# Patient Record
Sex: Male | Born: 1965 | Race: White | Hispanic: Refuse to answer | Smoking: Current every day smoker
Health system: Southern US, Community
[De-identification: ages and names within clinical notes are randomized; demographics above are authoritative.]

## PROBLEM LIST (undated history)

## (undated) DIAGNOSIS — F419 Anxiety disorder, unspecified: Secondary | ICD-10-CM

## (undated) DIAGNOSIS — F32A Depression, unspecified: Secondary | ICD-10-CM

## (undated) DIAGNOSIS — F329 Major depressive disorder, single episode, unspecified: Secondary | ICD-10-CM

## (undated) DIAGNOSIS — Z5189 Encounter for other specified aftercare: Secondary | ICD-10-CM

## (undated) DIAGNOSIS — F1910 Other psychoactive substance abuse, uncomplicated: Secondary | ICD-10-CM

## (undated) HISTORY — DX: Encounter for other specified aftercare: Z51.89

## (undated) HISTORY — DX: Anxiety disorder, unspecified: F41.9

## (undated) HISTORY — DX: Other psychoactive substance abuse, uncomplicated: F19.10

## (undated) HISTORY — DX: Depression, unspecified: F32.A

## (undated) HISTORY — DX: Major depressive disorder, single episode, unspecified: F32.9

## (undated) HISTORY — PX: OTHER SURGICAL HISTORY: SHX169

---

## 2016-02-21 ENCOUNTER — Ambulatory Visit (INDEPENDENT_AMBULATORY_CARE_PROVIDER_SITE_OTHER): Payer: Self-pay | Admitting: Physician Assistant

## 2016-02-21 ENCOUNTER — Encounter: Payer: Self-pay | Admitting: Physician Assistant

## 2016-02-21 VITALS — BP 118/62 | HR 72 | Temp 97.7°F | Resp 18 | Ht 67.0 in | Wt 218.0 lb

## 2016-02-21 DIAGNOSIS — M5441 Lumbago with sciatica, right side: Secondary | ICD-10-CM

## 2016-02-21 DIAGNOSIS — F319 Bipolar disorder, unspecified: Secondary | ICD-10-CM

## 2016-02-21 DIAGNOSIS — M542 Cervicalgia: Secondary | ICD-10-CM

## 2016-02-21 DIAGNOSIS — B360 Pityriasis versicolor: Secondary | ICD-10-CM

## 2016-02-21 MED ORDER — KETOCONAZOLE 2 % EX SHAM: MEDICATED_SHAMPOO | CUTANEOUS | 0 refills | Status: AC

## 2016-02-21 MED ORDER — CYCLOBENZAPRINE HCL 10 MG PO TABS: 10.0000 mg | ORAL_TABLET | Freq: Three times a day (TID) | ORAL | 0 refills | Status: AC | PRN

## 2016-02-21 MED ORDER — PREDNISONE 20 MG PO TABS: ORAL_TABLET | ORAL | 0 refills | Status: DC

## 2016-02-22 NOTE — Progress Notes (Signed)
Patient ID: Paul OuDonald Herrera MRN: [id number], DOB: 08/25/1965, 50 y.o. Date of Encounter: @DATE @  Chief Complaint:  Chief Complaint  Patient presents with  . New Patient (Initial Visit)        HPI: 50 y.o. year old male  [facility]   He says he recently moved here from [place] Says that when living there he only went to PCP prn.   He has had problems with Depression for >20 years. Has Bipolar D/O and Anxiety D/O. Was seeing a Theme park managers[facility]iatrist and Counselor in [place] and plans to go to [facility] here.   He has several things he wants to address today:  Neck Pain: Says this started August 22nd. He remembers because he moved August 21st. Says he thought the neck pain may be secondary to different, larger pillows. But, says he has changed pillows and neck pain has continued. Pain is on right side of neck. He hears popping/cracking sounds there when he moves his neck. No pain, numbness, tingling, wekaness down the arm or hand.   He does not work. Is on Disability "I have social anxiety, I can't deal with people".   Has been doing no activity that affects his neck per se.   Right "hip" pain: Has been feeling this for ~ 1 week. It gets worse as the day progresses. The more he is up doing things. He is unaware of any trauma or injury or anything that caused this pain. Points to lateral aspect of hip as area of pain.   Rash: Has had this rash on chest for months. Has not used any treatment to it.   No other concerns to address today.    Past Medical History:  Diagnosis Date  . Anxiety   . Blood transfusion without reported diagnosis   . Depression   . Substance abuse      Home Meds: No outpatient prescriptions prior to visit.   No facility-administered medications prior to visit.     Allergies: No Known Allergies  Social History   Social History  . Marital status: Divorced    Spouse name: N/A  . Number of children:  N/A  . Years of education: N/A   Occupational History  . Not on file.   Social History Main Topics  . Smoking status: Current Every Day Smoker    Packs/day: 2.00    Years: 15.00  . Smokeless tobacco: Never Used  . Alcohol use Yes     Comment: sober 2 months  . Drug use: No  . Sexual activity: No   Other Topics Concern  . Not on file   Social History Narrative  . No narrative on file    History reviewed. No pertinent family history.   Review of Systems:  See HPI for pertinent ROS. All other ROS negative.    Physical Exam: Blood pressure 118/62, pulse 72, temperature 97.7 F (36.5 C), temperature source Oral, resp. rate 18, height 5\' 7"  (1.702 m), weight 218 lb (98.9 kg), SpO2 96 %., Body mass index is 34.14 kg/m. General: WM. Appears in no acute distress. Neck: Supple. No thyromegaly. No lymphadenopathy. Lungs: Clear bilaterally to auscultation without wheezes, rales, or rhonchi. Breathing is unlabored. Heart: RRR with S1 S2. No murmurs, rubs, or gallops. Abdomen: Soft, non-tender, non-distended with normoactive bowel sounds. No hepatomegaly. No rebound/guarding. No obvious abdominal masses. Musculoskeletal:  Right side of neck is severely tender with palpation.  When he turns his head  to the right, he feesl pain in right neck. When he turns head to left, feels tightness/pain in right neck.  The same occurs when he tilts head to each side.  5/5 strength in bilateral arms and grips strength 5/5 bilaterally.  StraightLegRaise normal bilaterally. Left hip abduction normal. Right hip abduction causes pain in right low back. Skin: Chest covered with macular rash that is salmon-colored, splotchy Neuro: Alert and oriented X 3. Moves all extremities spontaneously. Gait is normal. CNII-XII grossly in tact. Psych:  Responds to questions appropriately with a normal affect.     ASSESSMENT AND PLAN:  50 y.o. year old male with  1. Neck pain on right side Discussed making sure  his pillows are right for him--to maintain correct cervical spine positioning. Discussed apply heat to area using heating pad or warm water in shower.  Discussed stretching, ROM throughout the day of neck and shoulders.  Cautioned that Flexeril may cause drwosiness. Do not take prior to driving.  - cyclobenzaprine (FLEXERIL) 10 MG tablet; Take 1 tablet (10 mg total) by mouth 3 (three) times daily as needed for muscle spasms.  Dispense: 30 tablet; Refill: 0  2. Acute right-sided low back pain with right-sided sciatica Discussed this is coming from low back, sciatic nerve.  Discussed applying heat to area, stretching.  Take prednisone taper as directed. Use Flexeril prn. Cautioned that Flexeril may cause drwosiness. Do not take prior to driving.   - predniSONE (DELTASONE) 20 MG tablet; Take 3 daily for 2 days, then 2 daily for 2 days, then 1 daily for 2 days.  Dispense: 12 tablet; Refill: 0 - cyclobenzaprine (FLEXERIL) 10 MG tablet; Take 1 tablet (10 mg total) by mouth 3 (three) times daily as needed for muscle spasms.  Dispense: 30 tablet; Refill: 0  3. Tinea versicolor He is to apply as directed for several weeks, until rash resolves.  - ketoconazole (NIZORAL) 2 % shampoo; Apply to affected area, leave on for 5 minutes, rinse.  Dispense: 120 mL; Refill: 0  4. Bipolar affective disorder, remission status unspecified (HCC) Managed by Psych  Discussed having him schedule CPE and he is agreeable.  To schedule early morning--come fasting.    Signed, [address] [place], [place], BSFM 02/22/2016 8:30 AM

## 2016-03-17 ENCOUNTER — Encounter: Payer: Self-pay | Admitting: Physician Assistant

## 2016-04-16 ENCOUNTER — Ambulatory Visit
Admission: RE | Admit: 2016-04-16 | Discharge: 2016-04-16 | Disposition: A | Discharge status: Home / Self Care | Payer: Medicare Other | Source: Ambulatory Visit | Attending: Physician Assistant | Admitting: Physician Assistant

## 2016-04-16 ENCOUNTER — Encounter: Payer: Self-pay | Admitting: Physician Assistant

## 2016-04-16 ENCOUNTER — Ambulatory Visit (INDEPENDENT_AMBULATORY_CARE_PROVIDER_SITE_OTHER): Payer: Medicare Other | Admitting: Physician Assistant

## 2016-04-16 VITALS — BP 112/74 | HR 84 | Temp 98.7°F | Resp 12 | Ht 67.0 in | Wt 213.0 lb

## 2016-04-16 DIAGNOSIS — M545 Low back pain: Secondary | ICD-10-CM | POA: Diagnosis not present

## 2016-04-16 DIAGNOSIS — M542 Cervicalgia: Secondary | ICD-10-CM

## 2016-04-16 DIAGNOSIS — M5431 Sciatica, right side: Secondary | ICD-10-CM

## 2016-04-16 NOTE — Progress Notes (Signed)
Patient ID: Paul Herrera MRN: [id number], DOB: 07/05/1965, 51 y.o. Date of Encounter: @DATE @  Chief Complaint:  Chief Complaint  Patient presents with  . Pain Right Neck, Right Hip        HPI: 51 y.o. year old male    02/21/2016: Presents as a New Patient to Establish Care.   He says he recently moved here from [place] Says that when living there he only went to PCP prn.   He has had problems with Depression for >20 years. Has Bipolar D/O and Anxiety D/O. Was seeing a Theme [facility]iatrist and Counsel[place] in [place] and [place] to go [place] [facility] here.   He has several things he wants to address today:  Neck Pain: Says this started August 22nd. He remembers because he moved August 21st. Says he thought the neck pain may be secondary to different, larger pillows. But, says he has changed pillows and neck pain has continued. Pain is on right side of neck. He hears popping/cracking sounds there when he moves his neck. No pain, numbness, tingling, wekaness down the arm or hand.   He does not work. Is on Disability "I have social anxiety, I can't deal with people".   Has been doing no activity that affects his neck per se.   Right "hip" pain: Has been feeling this for ~ 1 week. It gets worse as the day progresses. The more he is up doing things. He is unaware of any trauma or injury or anything that caused this pain. Points to lateral aspect of hip as area of pain.   Rash: Has had this rash on chest for months. Has not used any treatment to it.   No other concerns to address today.    ASSESSMENT AND PLAN:  51 y.o. year old male with  1. Neck pain on right side Discussed making sure his pillows are right for him--to maintain correct cervical spine positioning. Discussed apply heat to area using heating pad or warm water in shower.  Discussed stretching, ROM throughout the day of neck and shoulders.  Cautioned that Flexeril may cause drwosiness. Do not take prior to  driving.  - cyclobenzaprine (FLEXERIL) 10 MG tablet; Take 1 tablet (10 mg total) by mouth 3 (three) times daily as needed for muscle spasms.  Dispense: 30 tablet; Refill: 0  2. Acute right-sided low back pain with right-sided sciatica Discussed this is coming from low back, sciatic nerve.  Discussed applying heat to area, stretching.  Take prednisone taper as directed. Use Flexeril prn. Cautioned that Flexeril may cause drwosiness. Do not take prior to driving.   - predniSONE (DELTASONE) 20 MG tablet; Take 3 daily for 2 days, then 2 daily for 2 days, then 1 daily for 2 days.  Dispense: 12 tablet; Refill: 0 - cyclobenzaprine (FLEXERIL) 10 MG tablet; Take 1 tablet (10 mg total) by mouth 3 (three) times daily as needed for muscle spasms.  Dispense: 30 tablet; Refill: 0  3. Tinea versicolor He is to apply as directed for several weeks, until rash resolves.  - ketoconazole (NIZORAL) 2 % shampoo; Apply to affected area, leave on for 5 minutes, rinse.  Dispense: 120 mL; Refill: 0  4. Bipolar affective disorder, remission status unspecified (HCC) Managed by Psych  Discussed having him schedule CPE and he is agreeable.  To schedule early morning--come fasting.    04/16/2016: Today he states that the right side of his neck hurts pretty much all the time. Says that if he  sits with his neck perfectly straight and still, he doesn't feel it much but if he moves his neck at all in any direction then he feels increased discomfort on the right side of his neck. He has had no pain down the right arm. No numbness or tingling or weakness in the right arm or hand.  He also has been feeling pain in the right sciatic notch region. This is where he points to. Says that whenever he is sitting-- that area is painful. Also whenever he initially stands up, it is painful there. No pain going down the leg. No numbness or tingling or weakness down the leg. Occasionally feels low back pain but this is mild.  Reviewed that  I had prescribed Flexeril at last visit and he says that that does help and he can tell that that does help relax these muscles but the pain has not completely resolved.   Past Medical History:  Diagnosis Date  . Anxiety   . Blood transfusion without reported diagnosis   . Depression   . Substance abuse      Home Meds: Outpatient Medications Prior to Visit  Medication Sig Dispense Refill  . cyclobenzaprine (FLEXERIL) 10 MG tablet Take 1 tablet (10 mg total) by mouth 3 (three) times daily as needed for muscle spasms. 30 tablet 0  . ketoconazole (NIZORAL) 2 % shampoo Apply to affected area, leave on for 5 minutes, rinse. 120 mL 0  . traZODone (DESYREL) 150 MG tablet Take 150 mg by mouth at bedtime.    . citalopram (CELEXA) 20 MG tablet Take 20 mg by mouth at bedtime.    . predniSONE (DELTASONE) 20 MG tablet Take 3 daily for 2 days, then 2 daily for 2 days, then 1 daily for 2 days. (Patient not taking: Reported on 04/16/2016) 12 tablet 0   No facility-administered medications prior to visit.     Allergies: No Known Allergies  Social History   Social History  . Marital status: Divorced    Spouse name: N/A  . Number of children: N/A  . Years of education: N/A   Occupational History  . Not on file.   Social History Main Topics  . Smoking status: Current Every Day Smoker    Packs/day: 2.00    Years: 15.00  . Smokeless tobacco: Never Used  . Alcohol use Yes     Comment: sober 2 months  . Drug use: No  . Sexual activity: No   Other Topics Concern  . Not on file   Social History Narrative  . No narrative on file    History reviewed. No pertinent family history.   Review of Systems:  See HPI for pertinent ROS. All other ROS negative.    Physical Exam: Blood pressure 112/74, pulse 84, temperature 98.7 F (37.1 C), temperature source Oral, resp. rate 12, height 5\' 7"  (1.702 m), weight 213 lb (96.6 kg), SpO2 96 %., Body mass index is 33.36 kg/m. General: WM. Appears in  no acute distress. Neck: Supple. No thyromegaly. No lymphadenopathy. Lungs: Clear bilaterally to auscultation without wheezes, rales, or rhonchi. Breathing is unlabored. Heart: RRR with S1 S2. No murmurs, rubs, or gallops. Abdomen: Soft, non-tender, non-distended with normoactive bowel sounds. No hepatomegaly. No rebound/guarding. No obvious abdominal masses. Musculoskeletal:  Right side of neck is mildly to tender with palpation.  When he turns his head to the right, he feels pain in right neck. When he turns head to left, feels tightness/pain in right neck.  The  same occurs when he tilts head to each side.  5/5 strength in bilateral arms and grips strength 5/5 bilaterally.  Straight Leg Raise---he can raise to >60 degrees on the right, but then causes pain at right sciatic notch. Straight leg raise on the left is normal. Right Hip Abduction: Can abduct fully but when he completely abducts this does cause pain in the right sciatic notch region. Left hip abduction normal.    Skin: Chest covered with macular rash that is salmon-colored, splotchy Neuro: Alert and oriented X 3. Moves all extremities spontaneously. Gait is normal. CNII-XII grossly in tact. Psych:  Responds to questions appropriately with a normal affect.     ASSESSMENT AND PLAN:  51 y.o. year old male with   1. Neck pain on right side Will obtain x-ray. Will follow-up with him once I get this result. He states that the Flexeril does help so he can continue using this in the interim. Also at last visit we already discussed applying heat and stretches and range of motion. - DG Cervical Spine Complete; Future  2. Right sided sciatica Will obtain x-ray. Will follow-up with him once I get this resolved. He states that the Flexeril does help so he can continue using this in the interim. Also at the last visit already discussed applying heat and stretches for the low back. - DG Lumbar Spine Complete; Future     Signed, Shon [address] [place], [place], [facility] 04/16/2016 12:46 PM

## 2016-09-26 ENCOUNTER — Telehealth: Payer: Self-pay

## 2016-09-26 NOTE — Telephone Encounter (Signed)
[facility] needed to hve [facility] exam done. Tried calling pt no answer and no VM option. Will mail letter for pt to call and schedule an appointment

## 2016-10-06 ENCOUNTER — Encounter: Payer: Self-pay | Admitting: Physician Assistant

## 2016-10-10 ENCOUNTER — Encounter: Payer: Self-pay | Admitting: Physician Assistant

## 2017-01-10 IMAGING — CR DG CERVICAL SPINE COMPLETE 4+V
6 series · 6 of 6 positions shown · non-contrast
Comparison: None

CLINICAL DATA: RIGHT-sided neck pain for 4 months, no trauma,
radiation of pain to RIGHT arm

EXAM:
CERVICAL SPINE - COMPLETE 4+ VIEW

[w cervical spine lat]
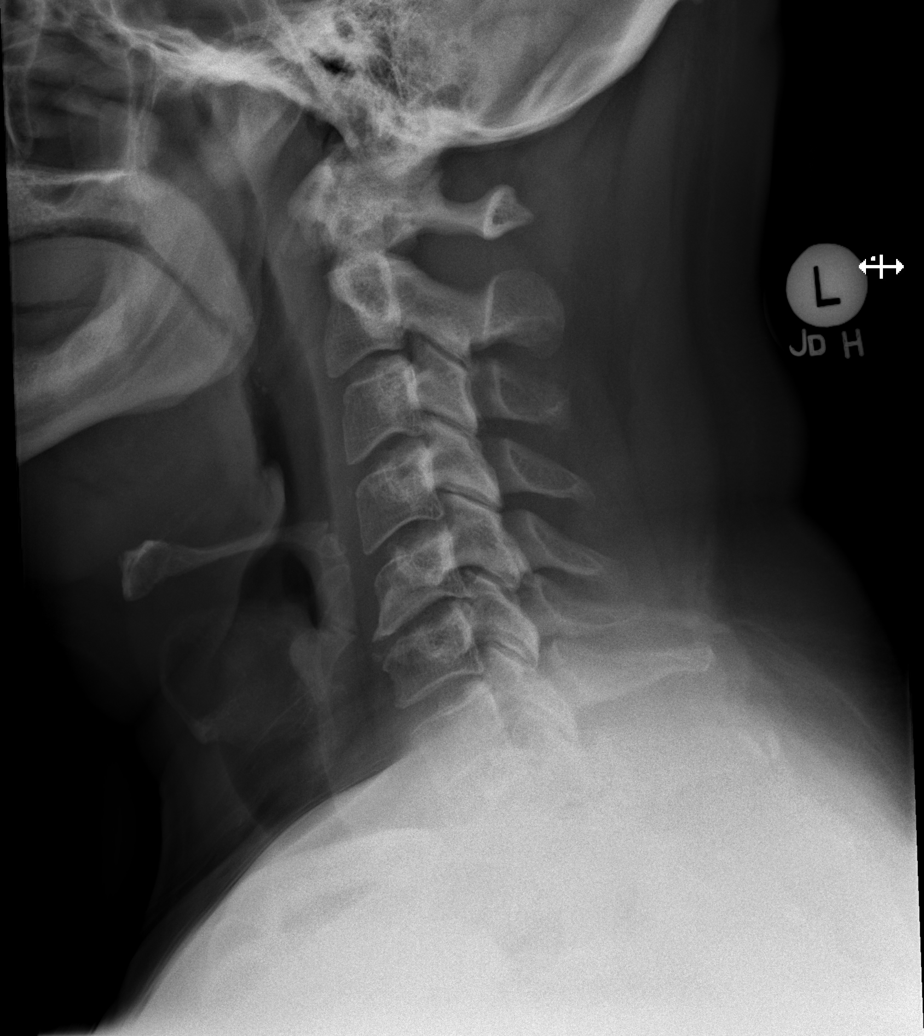

[w cervical spine ap_obl (1 of 2)]
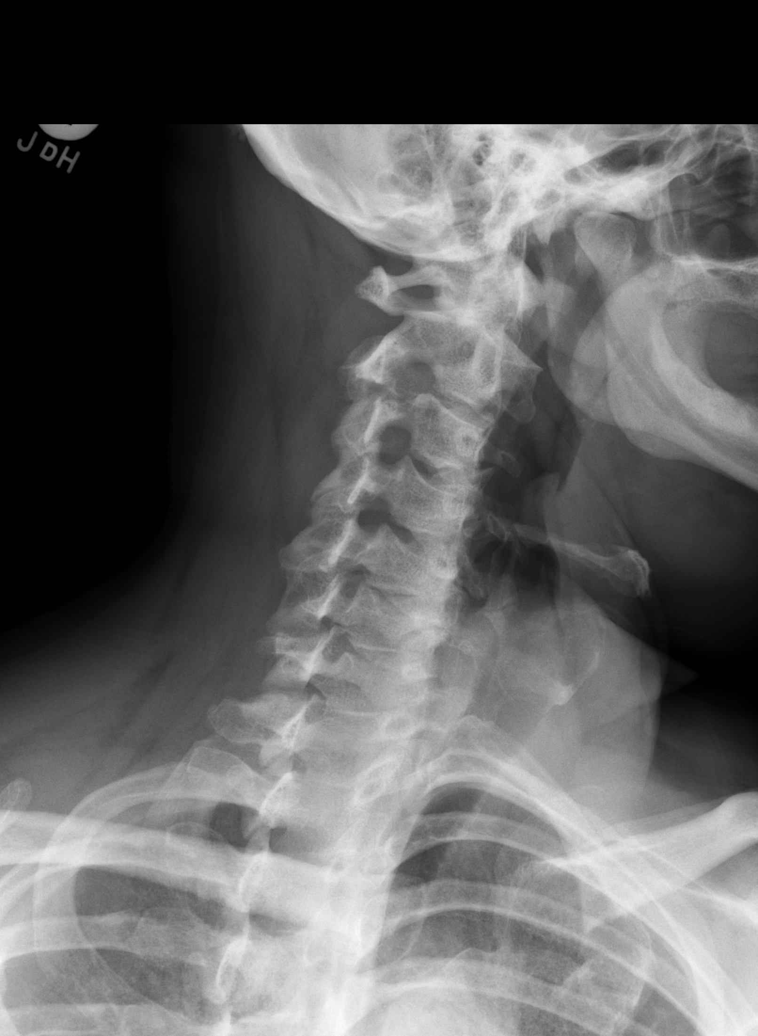

[w cervical spine ap_obl (2 of 2)]
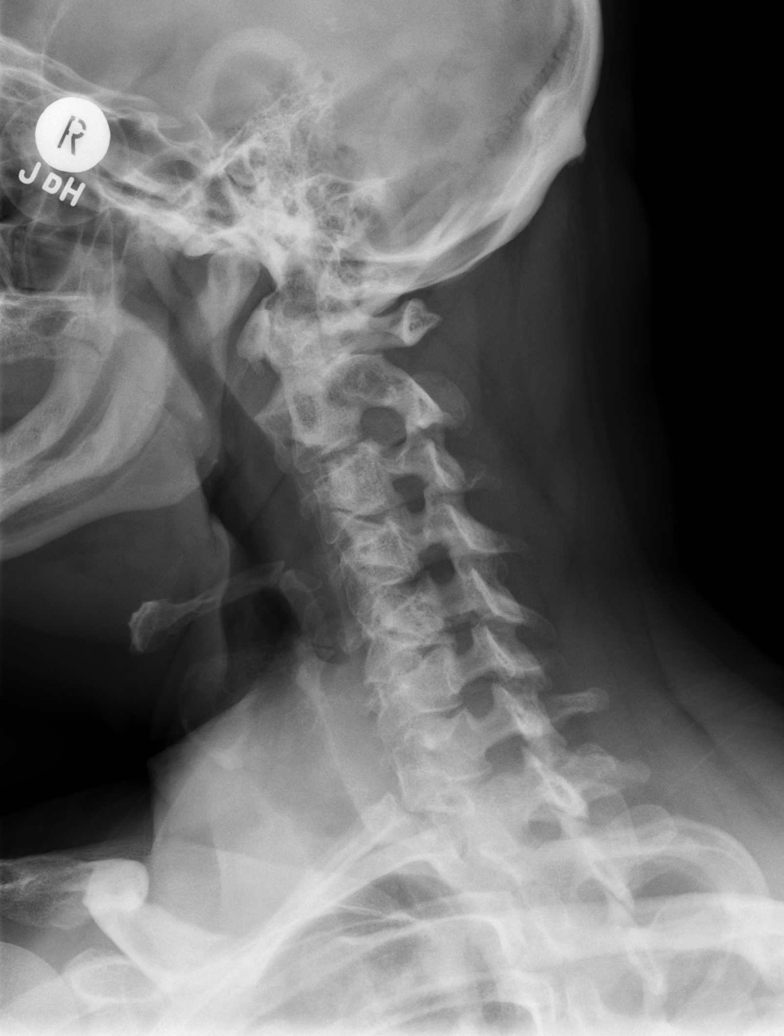

[w cervical spine ap]
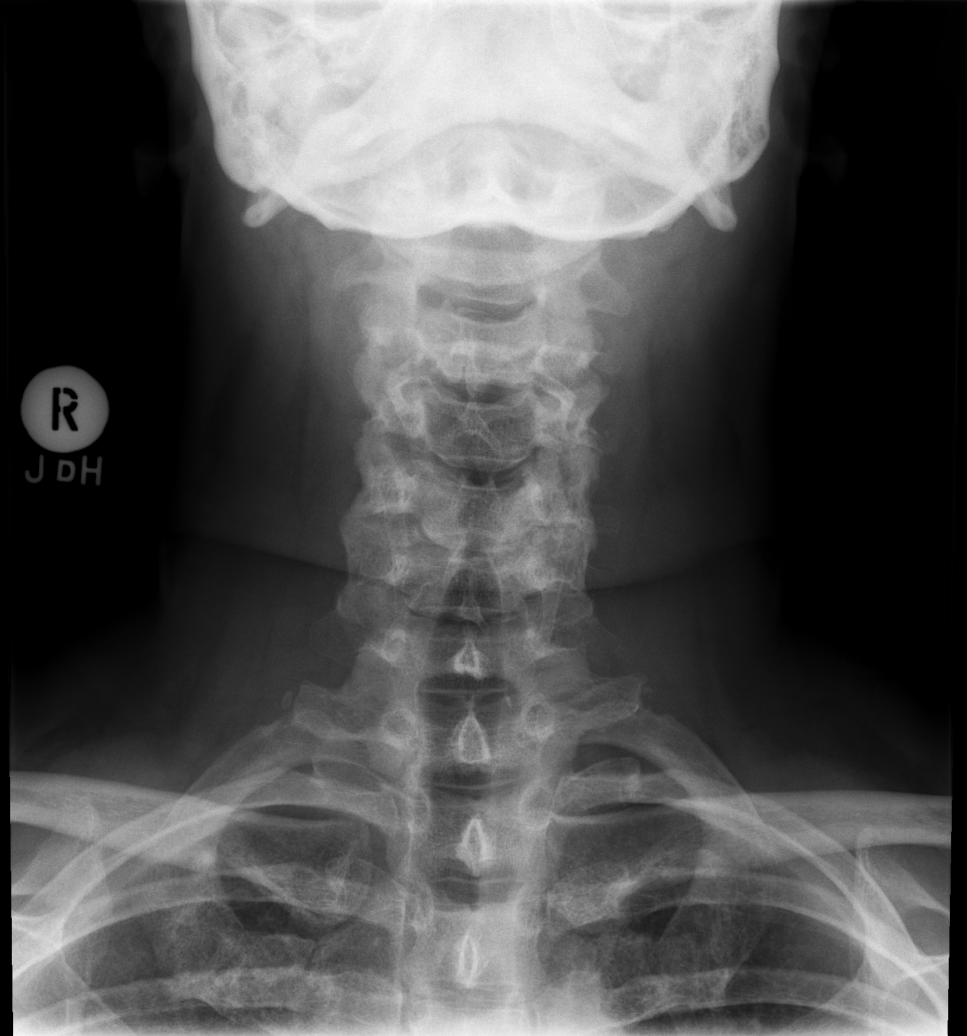

[w cervical swimmers]
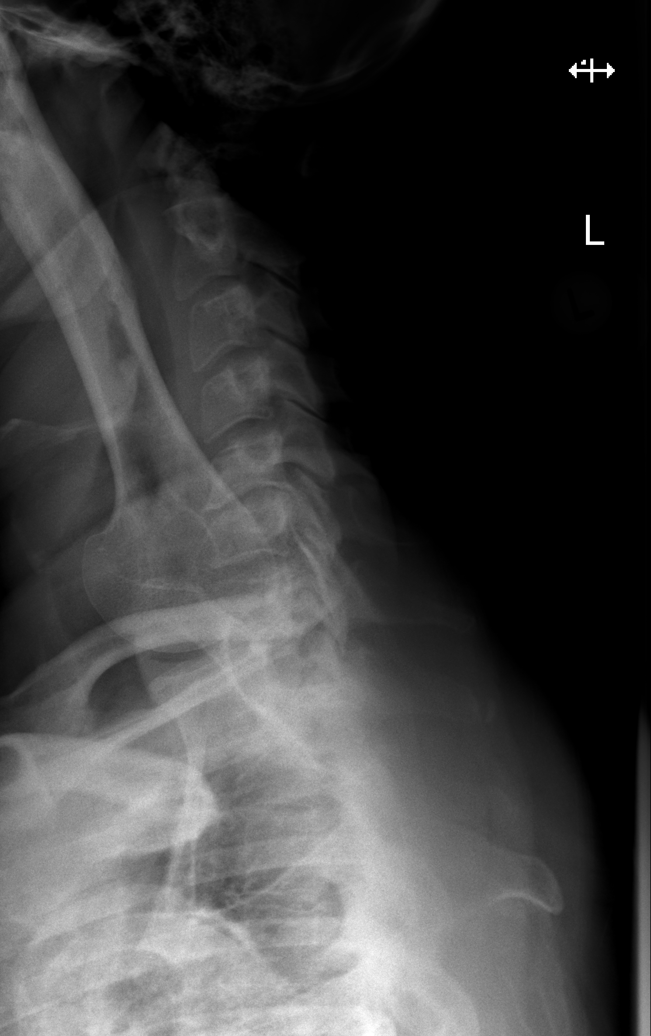

[t cervical spine odontoid]
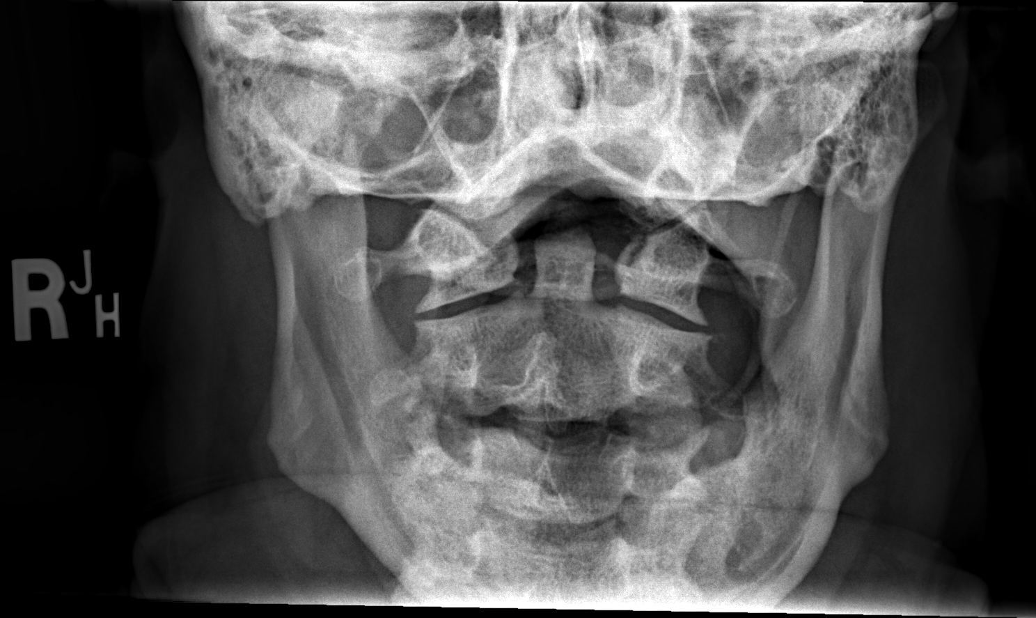

[6 of 6 positions shown; findings below may reference images not displayed]

FINDINGS: Prevertebral soft tissues normal thickness.

Disc space narrowing with endplate spur formation at C5-C6.

Minimal retrolisthesis at C5-C6.

Vertebral body heights maintained without fracture or additional
subluxation.

Small uncovertebral spurs bilaterally at C5-C6 without significant
neural foraminal narrowing.

Lung apices clear.

C1-C2 alignment normal.
IMPRESSION: Degenerative disc disease changes at C5-C6 with minimal
retrolisthesis as above.

No acute osseous abnormalities.

## 2017-01-10 IMAGING — CR DG LUMBAR SPINE COMPLETE 4+V
5 series · 5 of 5 positions shown · non-contrast
Comparison: None

CLINICAL DATA: Progression of RIGHT-sided neck pain into RIGHT side
of low back for 3 months, RIGHT hip and RIGHT leg pain extending to
foot, no history of trauma, RIGHT side sciatica

EXAM:
LUMBAR SPINE - COMPLETE 4+ VIEW

[w lumbar spine ap]
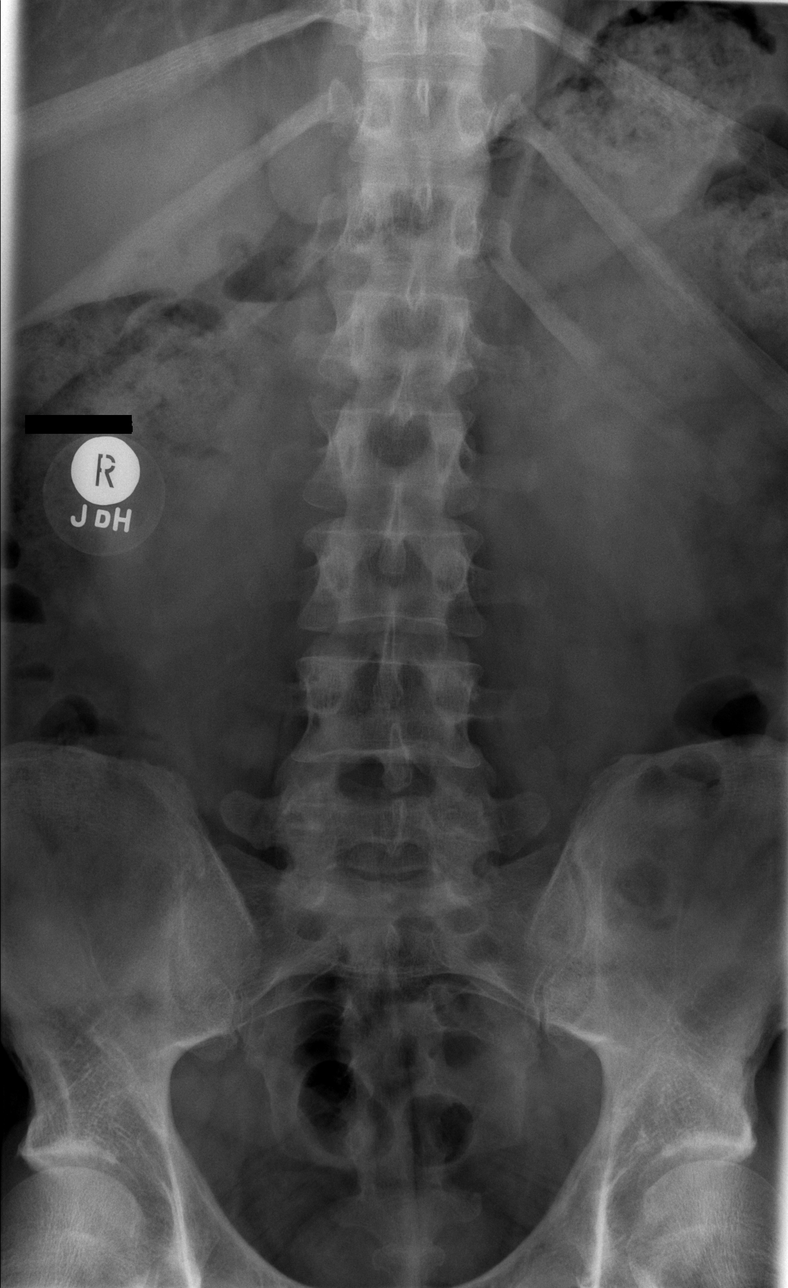

[w lumbar spine obl (1 of 2)]
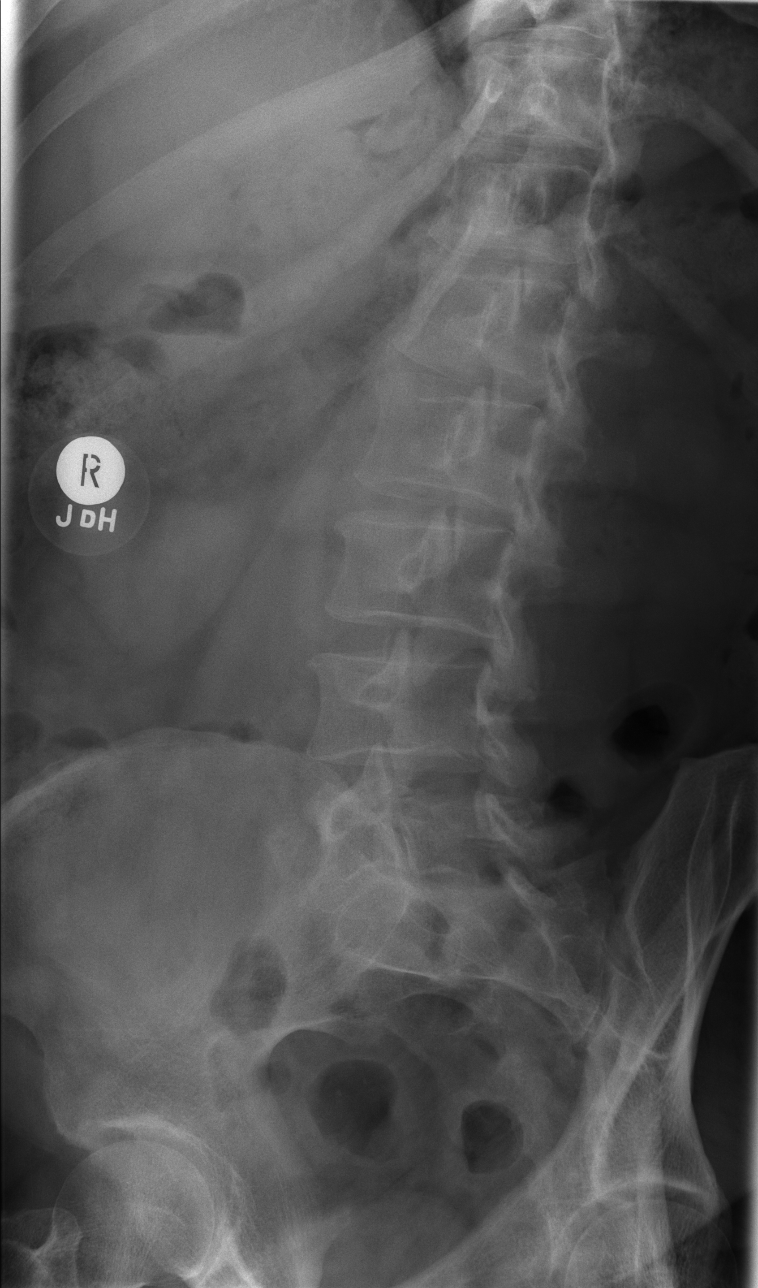

[w lumbar spine obl (2 of 2)]
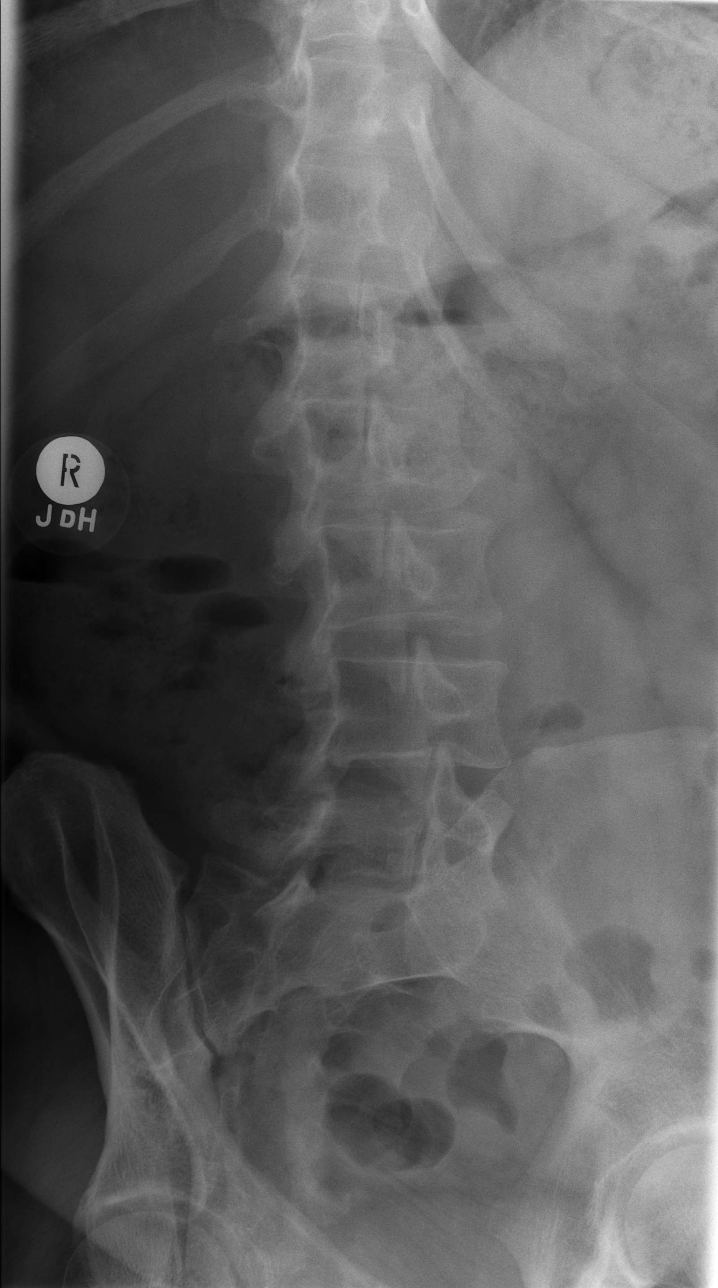

[w lumbar spine lat]
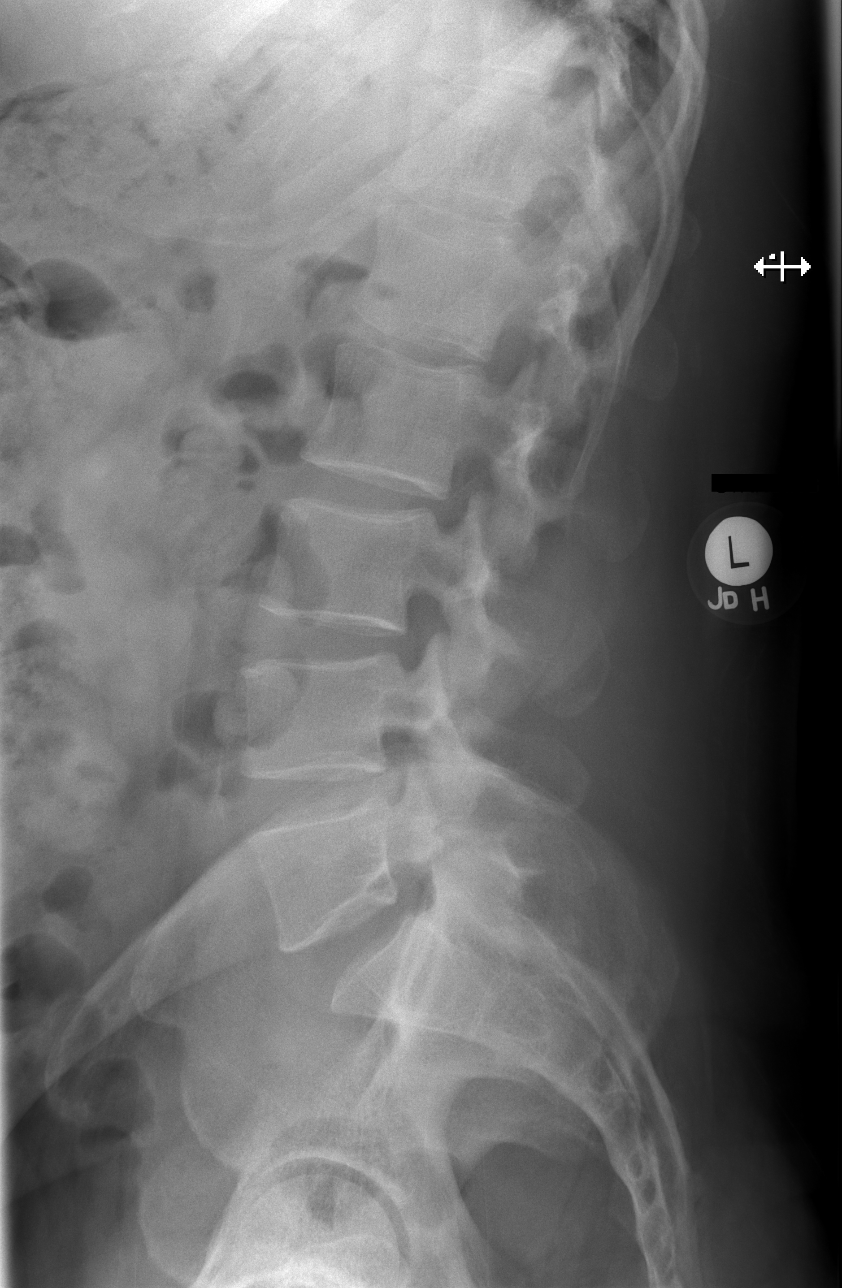

[w lumbar l-5 s-1 spot]
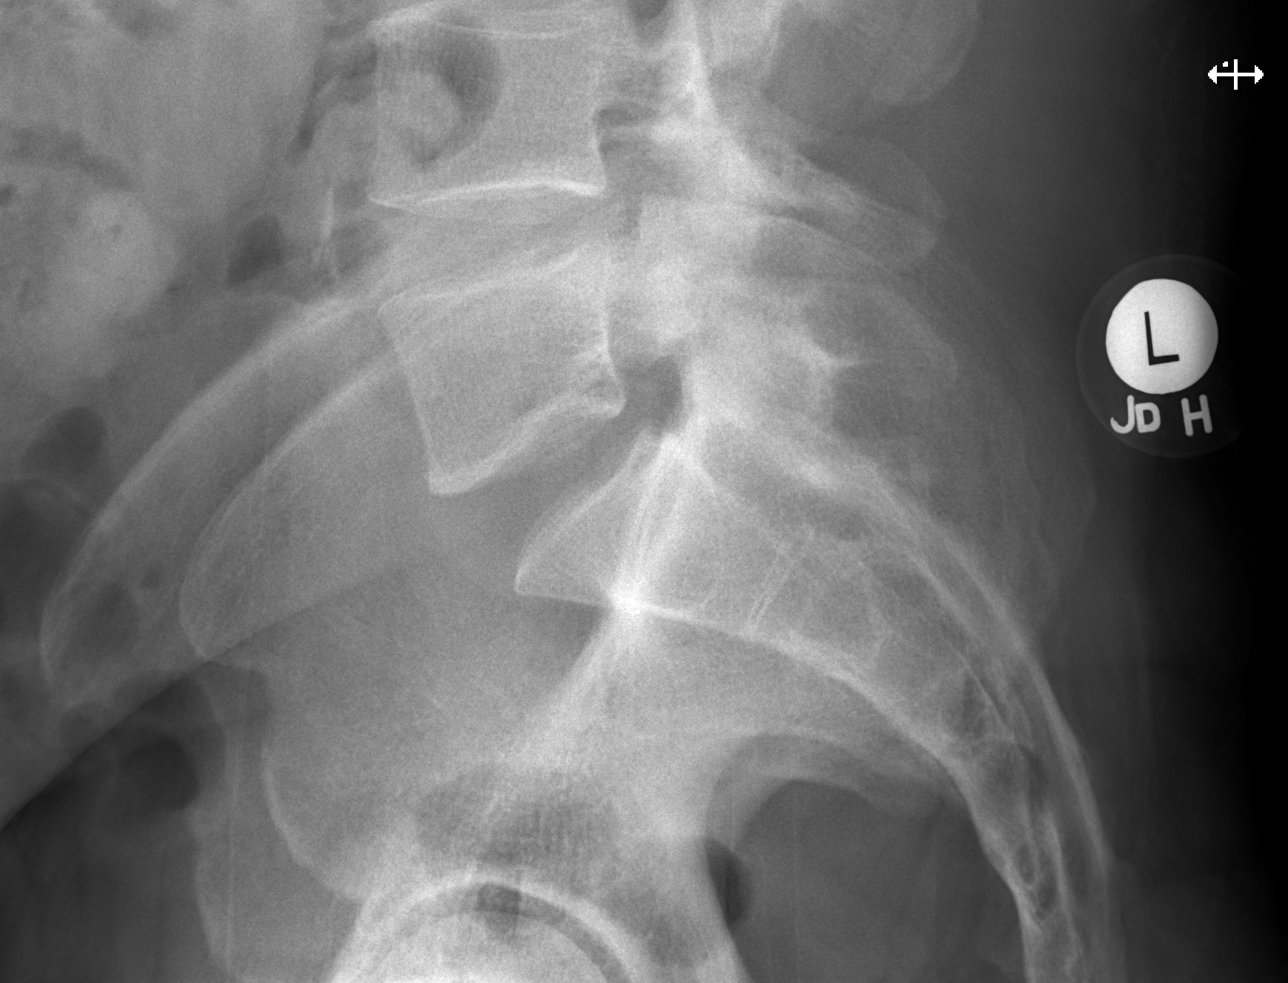

[5 of 5 positions shown; findings below may reference images not displayed]

FINDINGS: 5 non-rib-bearing lumbar vertebra.

Bones appear demineralized.

Vertebral body and disc space heights maintained.

No acute fracture, subluxation, or bone destruction.

SI joints symmetric.

No spondylolysis identified.
IMPRESSION: No acute osseous abnormalities.

## 2017-07-01 ENCOUNTER — Encounter: Payer: Medicare Other | Admitting: Physician Assistant

## 2017-07-01 ENCOUNTER — Encounter: Payer: Self-pay | Admitting: Physician Assistant

## 2017-07-02 ENCOUNTER — Encounter: Payer: Medicare Other | Admitting: Physician Assistant

## 2017-07-02 ENCOUNTER — Encounter: Payer: Self-pay | Admitting: Physician Assistant

## 2018-03-23 DIAGNOSIS — J189 Pneumonia, unspecified organism: Secondary | ICD-10-CM | POA: Diagnosis not present

## 2018-03-26 DIAGNOSIS — J189 Pneumonia, unspecified organism: Secondary | ICD-10-CM | POA: Diagnosis not present

## 2018-08-04 DIAGNOSIS — R3913 Splitting of urinary stream: Secondary | ICD-10-CM | POA: Diagnosis not present

## 2018-08-04 DIAGNOSIS — R3912 Poor urinary stream: Secondary | ICD-10-CM | POA: Diagnosis not present

## 2018-08-04 DIAGNOSIS — R3916 Straining to void: Secondary | ICD-10-CM | POA: Diagnosis not present

## 2018-08-04 DIAGNOSIS — R3915 Urgency of urination: Secondary | ICD-10-CM | POA: Diagnosis not present

## 2018-08-06 DIAGNOSIS — Z0000 Encounter for general adult medical examination without abnormal findings: Secondary | ICD-10-CM | POA: Diagnosis not present

## 2018-08-06 DIAGNOSIS — J449 Chronic obstructive pulmonary disease, unspecified: Secondary | ICD-10-CM | POA: Diagnosis not present

## 2018-08-06 DIAGNOSIS — E119 Type 2 diabetes mellitus without complications: Secondary | ICD-10-CM | POA: Diagnosis not present

## 2018-08-06 DIAGNOSIS — G4700 Insomnia, unspecified: Secondary | ICD-10-CM | POA: Diagnosis not present

## 2018-08-06 DIAGNOSIS — E11319 Type 2 diabetes mellitus with unspecified diabetic retinopathy without macular edema: Secondary | ICD-10-CM | POA: Diagnosis not present

## 2018-08-16 DIAGNOSIS — E118 Type 2 diabetes mellitus with unspecified complications: Secondary | ICD-10-CM | POA: Diagnosis not present

## 2018-08-16 DIAGNOSIS — E11319 Type 2 diabetes mellitus with unspecified diabetic retinopathy without macular edema: Secondary | ICD-10-CM | POA: Diagnosis not present

## 2018-08-16 DIAGNOSIS — E785 Hyperlipidemia, unspecified: Secondary | ICD-10-CM | POA: Diagnosis not present

## 2018-08-16 DIAGNOSIS — J449 Chronic obstructive pulmonary disease, unspecified: Secondary | ICD-10-CM | POA: Diagnosis not present

## 2018-08-31 DIAGNOSIS — E11319 Type 2 diabetes mellitus with unspecified diabetic retinopathy without macular edema: Secondary | ICD-10-CM | POA: Diagnosis not present

## 2018-08-31 DIAGNOSIS — J449 Chronic obstructive pulmonary disease, unspecified: Secondary | ICD-10-CM | POA: Diagnosis not present

## 2018-08-31 DIAGNOSIS — E118 Type 2 diabetes mellitus with unspecified complications: Secondary | ICD-10-CM | POA: Diagnosis not present

## 2018-08-31 DIAGNOSIS — E785 Hyperlipidemia, unspecified: Secondary | ICD-10-CM | POA: Diagnosis not present

## 2018-10-06 DIAGNOSIS — N35811 Other urethral stricture, male, meatal: Secondary | ICD-10-CM | POA: Diagnosis not present

## 2018-10-11 DIAGNOSIS — R3912 Poor urinary stream: Secondary | ICD-10-CM | POA: Diagnosis not present

## 2018-10-25 ENCOUNTER — Encounter (INDEPENDENT_AMBULATORY_CARE_PROVIDER_SITE_OTHER): Payer: Medicare Other | Admitting: Ophthalmology

## 2020-01-11 ENCOUNTER — Encounter: Payer: Self-pay | Admitting: Family Medicine

## 2020-01-11 ENCOUNTER — Ambulatory Visit: Payer: Self-pay

## 2020-01-11 ENCOUNTER — Other Ambulatory Visit: Payer: Self-pay

## 2020-01-11 ENCOUNTER — Ambulatory Visit: Payer: 59 | Admitting: Family Medicine

## 2020-01-11 VITALS — BP 122/60 | HR 80 | Ht 67.0 in | Wt 194.0 lb

## 2020-01-11 DIAGNOSIS — M25511 Pain in right shoulder: Secondary | ICD-10-CM

## 2020-01-11 DIAGNOSIS — M25531 Pain in right wrist: Secondary | ICD-10-CM | POA: Diagnosis not present

## 2020-01-11 DIAGNOSIS — F5101 Primary insomnia: Secondary | ICD-10-CM

## 2020-01-11 DIAGNOSIS — M25532 Pain in left wrist: Secondary | ICD-10-CM | POA: Diagnosis not present

## 2020-01-11 DIAGNOSIS — M25512 Pain in left shoulder: Secondary | ICD-10-CM

## 2020-01-11 DIAGNOSIS — G8929 Other chronic pain: Secondary | ICD-10-CM

## 2020-01-11 MED ORDER — NORTRIPTYLINE HCL 25 MG PO CAPS: 25.0000 mg | ORAL_CAPSULE | Freq: Every day | ORAL | 2 refills | Status: DC

## 2020-01-11 NOTE — Patient Instructions (Addendum)
Thank you for coming in today.  Call or go to the ER if you develop a large red swollen joint with extreme pain or oozing puss.   Do the home exercises for rotator cuff tendonitis.  Please perform the exercise program that we have prepared for you and gone over in detail on a daily basis.  In addition to the handout you were provided you can access your program through: [email or web address]   Your unique program code is: [email or web address]   Try longer wrist braces with work.   Keep me updated.   Recheck in 6 weeks or sooner if needed.  Can do injections on the other side as early as 1 week from now.   Physical therapy would help as well.

## 2020-01-11 NOTE — Progress Notes (Signed)
Subjective:    I'm seeing this patient as a consultation for:  Dr. Thomasena Edis. Note will be routed back to referring provider/PCP.  CC: B hand/wrist pain  I, Paul Herrera, am serving as a Neurosurgeon for Dr. Clementeen Graham.  HPI: Pt is a 54 y/o male presenting w/ c/o B hand/wrist pain x approximately 8 months.  He locates his symptoms to around the wrist. 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 Pain is keeping patient up at night getting about two hours of sleep at night. Does not want to be on pills would like injection.  She also has pain in bilateral shoulders upper arm right worse than left.  Pain is worse with overhead motion and reaching back and at bedtime.  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  He has to pick in pushing pulling heavy objects frequently.   He denies significant numbness or tingling distally noting most of his pain is at volar wrist where the wrist braces end.   Radiating pain: yes up into shoulders and back UE numbness/tingling: no  UE weakness:yes feels like he has no strength Aggravating factors: driving; Lifting;  Treatments tried: wrist braces; naproxen; warm water soak    Past medical history, Surgical history, Family history, Social history, Allergies, and medications have been entered into the medical record, reviewed.   Review of Systems: No new headache, visual changes, nausea, vomiting, diarrhea, constipation, dizziness, abdominal pain, skin rash, fevers, chills, night sweats, weight loss, swollen lymph nodes, body aches, joint swelling, muscle aches, chest pain, shortness of breath, mood changes, visual or auditory hallucinations.   Objective:    Vitals:   01/11/20 0823  BP: 122/60  Pulse: 80  SpO2: 95%   General: Well Developed, well nourished, and in no acute distress.  Neuro/Psych: Alert and oriented x3, extra-ocular muscles intact, able to move all 4 extremities, sensation grossly intact. Skin: Warm and dry, no rashes noted.    Respiratory: Not using accessory muscles, speaking in full sentences, trachea midline.  Cardiovascular: Pulses palpable, no extremity edema. Abdomen: Does not appear distended. MSK:  C-spine normal-appearing nontender normal cervical motion. Particular tender palpation. Negative Spurling's test. Upper extremity strength reflexes and sensation are intact distally.  Reflexes are intact.  Right shoulder normal-appearing Nontender. Range of motion full abduction although with pain.  External rotation full.  Internal rotation lumbar spine. Strength intact abduction external and internal rotation. Positive Hawkins and Neer's test positive empty can test. Positive Yergason's and speeds test.  Left shoulder normal-appearing Nontender. Normal range of motion pain with abduction. Strength intact abduction external/internal rotation.  Mildly positive Hawkins and Neer's test.  Mildly positive empty can test. Negative Yergason's and speeds test.  Right wrist and hand.  Lacking distal phalanx right thumb otherwise normal-appearing Normal motion. Mildly tender palpation volar wrist approximately 4 cm proximal to wrist. Minimally positive Tinel's.  Negative Phalen's test Strength intact.  Sensation intact distally.  Left wrist and hand normal-appearing normal motion minimally tender palpation volar wrist 4 cm proximal to the wrist.  Minimally positive Tinel's and negative Phalen's test. Strength intact sensation intact distally.   Lab and Radiology Results  Procedure: Real-time Ultrasound Guided Injection of right shoulder subacromial bursa Device: Philips Affiniti 50G Images permanently stored and available for review in PACS Imaging of shoulder prior to injection. Hypoechoic fluid tracks within biceps tendon consistent with biceps tendinitis.  Tendon is intact.  Subscapularis small hyperechoic changes distal tendon insertion chronic calcific tendinopathy.  Supraspinatus  tendon intact with  some calcific changes and large subacromial bursa present.  Infraspinatus tendon normal Verbal informed consent obtained.  Discussed risks and benefits of procedure. Warned about infection bleeding damage to structures skin hypopigmentation and fat atrophy among others. Patient expresses understanding and agreement Time-out conducted.   Noted no overlying erythema, induration, or other signs of local infection.   Skin prepped in a sterile fashion.   Local anesthesia: Topical Ethyl chloride.   With sterile technique and under real time ultrasound guidance:  40 mg of Kenalog and 2 mL of Marcaine injected into subacromial bursa. Fluid seen entering the bursa.   Completed without difficulty   Pain partially  resolved suggesting accurate placement of the medication.   Advised to call if fevers/chills, erythema, induration, drainage, or persistent bleeding.   Images permanently stored and available for review in the ultrasound unit.  Impression: Technically successful ultrasound guided injection.     Procedure: Real-time Ultrasound Guided median nerve hydrodissection at right carpal tunnel Device: Philips Affiniti 50G Images permanently stored and available for review in PACS Ultrasound examination reveals large median nerve at carpal tunnel consistent with moderate carpal tunnel syndrome Verbal informed consent obtained.  Discussed risks and benefits of procedure. Warned about infection bleeding damage to structures skin hypopigmentation and fat atrophy among others. Patient expresses understanding and agreement Time-out conducted.   Noted no overlying erythema, induration, or other signs of local infection.   Skin prepped in a sterile fashion.   Local anesthesia: Topical Ethyl chloride.   With sterile technique and under real time ultrasound guidance:  40 mg of Kenalog and 1 mL of lidocaine injected into carpal tunnel around the median nerve. Fluid seen entering the carpal tunnel.   Completed  without difficulty   Pain immediately resolved suggesting accurate placement of the medication.   Advised to call if fevers/chills, erythema, induration, drainage, or persistent bleeding.   Images permanently stored and available for review in the ultrasound unit.  Impression: Technically successful ultrasound guided injection.    Impression and Recommendations:    Assessment and Plan: 54 y.o. male with bilateral shoulder and wrist pain.  Doubtful that this is truly carpal tunnel syndrome as a sole cause of pain.  Shoulder pain consistent with rotator cuff tendinopathy right worse than left.  Treated with subacromial injection and home exercise program as taught in clinic today by ATC.  Unfortunately he does not have a lot of ability to do physical therapy due to his demanding job although that would be the next step.  Right wrist pain originally probably was carpal tunnel syndrome.  Now the majority of his pain is due to think the impingement of the short wrist splint onto the volar wrist when he flexes his wrist.  He does still have some carpal tunnel syndrome symptoms.  Plan to treat with injection as above and transitioning to a longer wrist brace.  Plan to check back in 6 weeks.  Return sooner if needed.  Could proceed with left-sided injections as early as next week.  Additionally will use nortriptyline at bedtime to help with pain and also to help him sleep which may be helpful.Marland Kitchen  PDMP not reviewed this encounter. Orders Placed This Encounter  Procedures   [place] LIMITED JOINT SPACE STRUCTURES UP LEFT(NO LINKED CHARGES)    Standing Status:   Future    Number of Occurrences:   1    Standing Expiration Date:   01/10/2021    Order Specific Question:   Reason for  Exam (SYMPTOM  OR DIAGNOSIS REQUIRED)    Answer:   bilateral wrist pain    Order Specific Question:   Preferred imaging location?    Answer:   [facility] ordered this encounter  Medications     nortriptyline (PAMELOR) 25 MG capsule    Sig: Take 1-2 capsules (25-50 mg total) by mouth at bedtime.    Dispense:  60 capsule    Refill:  2    Discussed warning signs or symptoms. Please see discharge instructions. Patient expresses understanding.   The above documentation has been reviewed and is accurate and complete Clementeen Graham, M.D.

## 2020-01-18 ENCOUNTER — Ambulatory Visit (INDEPENDENT_AMBULATORY_CARE_PROVIDER_SITE_OTHER): Payer: 59 | Admitting: Family Medicine

## 2020-01-18 ENCOUNTER — Other Ambulatory Visit: Payer: Self-pay

## 2020-01-18 ENCOUNTER — Ambulatory Visit: Payer: Self-pay

## 2020-01-18 ENCOUNTER — Ambulatory Visit: Payer: 59 | Admitting: Family Medicine

## 2020-01-18 DIAGNOSIS — G5602 Carpal tunnel syndrome, left upper limb: Secondary | ICD-10-CM

## 2020-01-18 DIAGNOSIS — G8929 Other chronic pain: Secondary | ICD-10-CM

## 2020-01-18 DIAGNOSIS — M25532 Pain in left wrist: Secondary | ICD-10-CM

## 2020-01-18 DIAGNOSIS — M25512 Pain in left shoulder: Secondary | ICD-10-CM | POA: Diagnosis not present

## 2020-01-18 NOTE — Patient Instructions (Addendum)
Thank you for coming in today.  Call or go to the ER if you develop a large red swollen joint with extreme pain or oozing puss.   Keep me updated.   Work on those shoulder exercises.   If shoulder if not improving next step is xray and MRI.

## 2020-01-18 NOTE — Progress Notes (Signed)
   I, Christoper Fabian, LAT, ATC, am serving as scribe for Dr. Clementeen Graham.  Paul Herrera is a 54 y.o. male who presents to [facility] at [facility] today for f/u of B shoulder and B wrist pain.  He was last seen by Dr. Denyse Amass on 01/11/20 and had a R subacromial and R carpal tunnel injection.  He was also provided w/ a HEP consisting of R RC and periscapular strengthening exercises.  Since his last visit, pt reports that his R wrist is feeling better but the R shoulder not quite as much.  His L shoulder seems to be not as bad currently but his L wrist con't to bother him.    Pertinent review of systems: No fevers or chills  Relevant historical information: Bipolar   Exam:   General: Well Developed, well nourished, and in no acute distress.   MSK: Left wrist nonerythematous.  Nontender.    Lab and Radiology Results  Procedure: Real-time Ultrasound Guided hydrodissection and injection of medial nerve at carpal tunnel left  Device: Philips Affiniti 50G Images permanently stored and available for review in PACS Verbal informed consent obtained.  Discussed risks and benefits of procedure. Warned about infection bleeding damage to structures skin hypopigmentation and fat atrophy among others. Patient expresses understanding and agreement Time-out conducted.   Noted no overlying erythema, induration, or other signs of local infection.   Skin prepped in a sterile fashion.   Local anesthesia: Topical Ethyl chloride.   With sterile technique and under real time ultrasound guidance:  40 mg of Kenalog and 1 mL of lidocaine injected into carpal tunnel superficial to medial nerve. Fluid seen entering the carpal tunnel.   Completed without difficulty   Pain immediately resolved suggesting accurate placement of the medication.   Advised to call if fevers/chills, erythema, induration, drainage, or persistent bleeding.   Images permanently stored and available for review in the  ultrasound unit.  Impression: Technically successful ultrasound guided injection.     Assessment and Plan: 54 y.o. male with left carpal tunnel syndrome plan for injection/Hydro dissection today.  Recheck back as needed.   Additionally discussed plan and protocol for continued right shoulder pain and right carpal tunnel syndrome.  Discussed bracing home exercise program and neck steps including potential x-ray/MRI shoulder and surgery or physical therapy.  PDMP not reviewed this encounter. Orders Placed This Encounter  Procedures  . [place] LIMITED JOINT SPACE STRUCTURES UP LEFT(NO LINKED CHARGES)    Order Specific Question:   Reason for Exam (SYMPTOM  OR DIAGNOSIS REQUIRED)    Answer:   L shoulder pain    Order Specific Question:   Preferred imaging location?    Answer:   [facility]   No orders of the defined types were placed in this encounter.    Discussed warning signs or symptoms. Please see discharge instructions. Patient expresses understanding.   The above documentation has been reviewed and is accurate and complete Clementeen Graham, M.D.  Total encounter time 15 minutes including face-to-face time with the patient and, reviewing past medical record, and charting on the date of service.   Discussed right wrist carpal tunnel syndrome as well as right shoulder pain.  Time excludes time to perform injection

## 2020-01-18 NOTE — Progress Notes (Deleted)
   I, Christoper Fabian, LAT, ATC, am serving as scribe for Dr. Clementeen Graham.  Paul Herrera is a 54 y.o. male who presents to [facility] at [facility] today for f/u of B wrist and shoulder pain.  He was last seen by Dr. Denyse Amass on 01/11/20 and had a R subacromial and R carpal tunnel injection.  He was also provided a R RC HEP.  Since his last visit, pt reports   Pertinent review of systems: ***  Relevant historical information: ***   Exam:  There were no vitals taken for this visit. General: Well Developed, well nourished, and in no acute distress.   MSK: ***    Lab and Radiology Results No results found for this or any previous visit (from the past 72 hour(s)). No results found.     Assessment and Plan: 54 y.o. male with ***   PDMP not reviewed this encounter. No orders of the defined types were placed in this encounter.  No orders of the defined types were placed in this encounter.    Discussed warning signs or symptoms. Please see discharge instructions. Patient expresses understanding.   ***

## 2020-02-02 ENCOUNTER — Other Ambulatory Visit: Payer: Self-pay | Admitting: Family Medicine

## 2020-02-28 NOTE — Progress Notes (Signed)
NO show

## 2020-02-29 ENCOUNTER — Ambulatory Visit (INDEPENDENT_AMBULATORY_CARE_PROVIDER_SITE_OTHER): Payer: 59 | Admitting: Family Medicine

## 2020-02-29 DIAGNOSIS — G8929 Other chronic pain: Secondary | ICD-10-CM

## 2020-02-29 DIAGNOSIS — Z5329 Procedure and treatment not carried out because of patient's decision for other reasons: Secondary | ICD-10-CM

## 2020-04-24 NOTE — Progress Notes (Signed)
I, Paul Herrera, LAT, ATC, am serving as scribe for Dr. Clementeen Herrera.  Paul Herrera is a 55 y.o. male who presents to [facility] at [facility] today for f/u of B wrist/hand/carpal tunnel pain.  He was last seen by Dr. Denyse Herrera on 01/18/20 and had a L carpal tunnel injection.  Prior to that, he had a R carpal tunnel injection on 01/11/20.  Since his last visit w/ Dr. Denyse Herrera, pt reports bilat wrists are starting to worsen again. Pt locates pain to anterior aspect of wrist bilaterally. No constant numbness/tingling noted.  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 He can't afford to take time off of work right now for surgery. Fortunately he denies any weakness. He has stopped using the wrist braces at night.   Pertinent review of systems: No fevers or chills  Relevant historical information: Controlled diabetes   Exam:  BP 118/74 (BP Location: Right Arm, Patient Position: Sitting, Cuff Size: Normal)    Pulse 76    Ht 5\' 7"  (1.702 m)    Wt 204 lb 12.8 oz (92.9 kg)    SpO2 96%    BMI 32.08 kg/m  General: Well Developed, well nourished, and in no acute distress.   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 Positive Tinel's carpal tunnel. Normal wrist and hand motion. Normal strength.  Left wrist normal-appearing. Nontender. Positive Tinel's. Normal strength.    Lab and Radiology Results  Procedure: Real-time Ultrasound Guided hydrodissection median nerve left carpal tunnel Device: Philips Affiniti 50G Images permanently stored and available for review in PACS Verbal informed consent obtained.  Discussed risks and benefits of procedure. Warned about infection bleeding damage to structures skin hypopigmentation and fat atrophy among others. Patient expresses understanding and agreement Time-out conducted.   Noted no overlying erythema, induration, or other signs of local  infection.   Skin prepped in a sterile fashion.   Local anesthesia: Topical Ethyl chloride.   With sterile technique and under real time ultrasound guidance:  40 mg of Kenalog and 1 mL of lidocaine injected into carpal tunnel around median nerve. Fluid seen entering the carpal tunnel.   Completed without difficulty   Pain immediately resolved suggesting accurate placement of the medication.   Advised to call if fevers/chills, erythema, induration, drainage, or persistent bleeding.   Images permanently stored and available for review in the ultrasound unit.  Impression: Technically successful ultrasound guided injection.   Procedure: Real-time Ultrasound Guided hydrodissection median nerve right carpal tunnel Device: Philips Affiniti 50G Images permanently stored and available for review in PACS Verbal informed consent obtained.  Discussed risks and benefits of procedure. Warned about infection bleeding damage to structures skin hypopigmentation and fat atrophy among others. Patient expresses understanding and agreement Time-out conducted.   Noted no overlying erythema, induration, or other signs of local infection.   Skin prepped in a sterile fashion.   Local anesthesia: Topical Ethyl chloride.   With sterile technique and under real time ultrasound guidance:  40 mg of Kenalog and 1 mL of lidocaine injected into into carpal tunnel around median nerve. Fluid seen entering the carpal tunnel space.   Completed without difficulty   Pain immediately resolved suggesting accurate placement of the medication.   Advised to call if fevers/chills, erythema, induration, drainage, or persistent bleeding.   Images permanently stored and available for review in the ultrasound unit.  Impression: Technically successful ultrasound guided  injection.      Assessment and Plan: 55 y.o. male with bilateral carpal tunnel syndrome. Plan for repeat injection today. Cautioned about hypoglycemia with 80 mg of  triamcinolone in 1 day.  Additionally discussed that ultimately I fear that injections are not going to solve his carpal tunnel problem. He likely will require surgery. He is trying to buy some time until he can get more stability in his life to afford to take time off of work for surgery. Can proceed with these injections every 3 months for a few more injections if needed.    Recheck back with me as needed.   PDMP not reviewed this encounter. Orders Placed This Encounter  Procedures   [place] LIMITED JOINT SPACE STRUCTURES UP BILAT(NO LINKED CHARGES)    Standing Status:   Future    Number of Occurrences:   1    Standing Expiration Date:   10/23/2020    Order Specific Question:   Reason for Exam (SYMPTOM  OR DIAGNOSIS REQUIRED)    Answer:   Bilateral wrist pain    Order Specific Question:   Preferred imaging location?    Answer:   [facility]   No orders of the defined types were placed in this encounter.    Discussed warning signs or symptoms. Please see discharge instructions. Patient expresses understanding.   The above documentation has been reviewed and is accurate and complete Paul Herrera, M.D.

## 2020-04-25 ENCOUNTER — Encounter: Payer: Self-pay | Admitting: Family Medicine

## 2020-04-25 ENCOUNTER — Ambulatory Visit (INDEPENDENT_AMBULATORY_CARE_PROVIDER_SITE_OTHER): Payer: BC Managed Care – PPO | Admitting: Family Medicine

## 2020-04-25 ENCOUNTER — Ambulatory Visit: Payer: Self-pay

## 2020-04-25 ENCOUNTER — Other Ambulatory Visit: Payer: Self-pay

## 2020-04-25 VITALS — BP 118/74 | HR 76 | Ht 67.0 in | Wt 204.8 lb

## 2020-04-25 DIAGNOSIS — M25532 Pain in left wrist: Secondary | ICD-10-CM | POA: Diagnosis not present

## 2020-04-25 DIAGNOSIS — G5603 Carpal tunnel syndrome, bilateral upper limbs: Secondary | ICD-10-CM

## 2020-04-25 DIAGNOSIS — M25531 Pain in right wrist: Secondary | ICD-10-CM

## 2020-04-25 HISTORY — DX: Carpal tunnel syndrome, bilateral upper limbs: G56.03

## 2020-04-25 NOTE — Patient Instructions (Addendum)
You had B wrist injections today.  Call or go to the ER if you develop a large red swollen joint with extreme pain or oozing puss.   We can do this up to every 3 months. Hopefully I wont have to see you for a while.   Use the wrist brace at night.   Start planning for surgery.

## 2020-04-28 ENCOUNTER — Encounter: Payer: Self-pay | Admitting: Family Medicine

## 2020-05-02 MED ORDER — GABAPENTIN 300 MG PO CAPS: 300.0000 mg | ORAL_CAPSULE | Freq: Three times a day (TID) | ORAL | 3 refills | Status: AC | PRN

## 2020-07-19 ENCOUNTER — Telehealth: Payer: Self-pay | Admitting: Family Medicine

## 2020-07-19 NOTE — Telephone Encounter (Signed)
error 

## 2020-07-23 NOTE — Progress Notes (Signed)
   Wynema Birch, am serving as a Neurosurgeon for Dr. Clementeen Graham.  Paul Herrera is a 55 y.o. male who presents to [facility] at [facility] today for f/u bilat wrist/hand pain Pt was last seen by Dr. Denyse Amass on 04/25/20 and was bilat carpal tunnel steroid injections. Today, pt reports that the injections helped for about 2 months and the wrists are in excruciating pain. Patient is wanting to talk about surgery.  Symptoms are severe in both wrists.  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   Pertinent review of systems: No fevers or chills  Relevant historical information: Bipolar disorder.   Exam:  BP 120/78 (BP Location: Left Arm, Patient Position: Sitting, Cuff Size: Normal)   Pulse 82   Ht 5\' 7"  (1.702 m)   Wt 193 lb (87.5 kg)   SpO2 98%   BMI 30.23 kg/m  General: Well Developed, well nourished, and in no acute distress.   MSK: Hands bilaterally no significant muscle atrophy.  Positive Tinel's bilateral wrists.  Intact grip strength.     Assessment and Plan: 55 y.o. male with bilateral wrist and hand pain and paresthesias thought to be due to carpal tunnel syndrome.  Patient has had repeated steroid injections.  Most recent injection only lasted 2 months.  At this point he is willing to consider surgery.  Plan for nerve conduction study.  Likely will referred to hand surgery directly after nerve conduction study.   PDMP not reviewed this encounter. Orders Placed This Encounter  Procedures  . Ambulatory referral to Neurology    Referral Priority:   Routine    Referral Type:   Consultation    Referral Reason:   Specialty Services Required    Requested Specialty:   Neurology    Number of Visits Requested:   1  . NCV with EMG(electromyography)    Standing Status:   Future    Standing Expiration Date:   07/24/2021    Order Specific Question:   Where should this test be performed?    Answer:   [facility]   No  orders of the defined types were placed in this encounter.    Discussed warning signs or symptoms. Please see discharge instructions. Patient expresses understanding.   The above documentation has been reviewed and is accurate and complete 09/23/2021, M.D.

## 2020-07-24 ENCOUNTER — Other Ambulatory Visit: Payer: Self-pay

## 2020-07-24 ENCOUNTER — Encounter: Payer: Self-pay | Admitting: Family Medicine

## 2020-07-24 ENCOUNTER — Ambulatory Visit (INDEPENDENT_AMBULATORY_CARE_PROVIDER_SITE_OTHER): Payer: Self-pay | Admitting: Family Medicine

## 2020-07-24 VITALS — BP 120/78 | HR 82 | Ht 67.0 in | Wt 193.0 lb

## 2020-07-24 DIAGNOSIS — G5603 Carpal tunnel syndrome, bilateral upper limbs: Secondary | ICD-10-CM

## 2020-07-24 NOTE — Patient Instructions (Addendum)
Good to see you   I am getting you set up for a Nerve Conduction Study of both upper extremities for carpal tunnel syndrome.  If you find out that it is faster with someone else let me know who and I will change the referral.   [facility] is who I set up.   I will get result usually about a week after the test and I will refer to surgery with that test unless it is a surprise.   Try the gabapentin at bedtime as needed. You can take up to 3 of the 300mg  pills at a time. Start at 1-2.   Keep me updated.    Electromyoneurogram Electromyoneurogram is a test to check how well your muscles and nerves are working. This procedure includes the combined use of electromyogram (EMG) and nerve conduction study (NCS). EMG is used to look for muscular disorders. NCS, which is also called electroneurogram, measures how well your nerves are controlling your muscles. The procedures are usually done together to check if your muscles and nerves are healthy. If the results of the tests are abnormal, this may indicate disease or injury, such as a neuromuscular disease or peripheral nerve damage. Tell a health care provider about:  Any allergies you have.  All medicines you are taking, including vitamins, herbs, eye drops, creams, and over-the-counter medicines.  Any problems you or family members have had with anesthetic medicines.  Any blood disorders you have.  Any surgeries you have had.  Any medical conditions you have.  If you have a pacemaker.  Whether you are pregnant or may be pregnant. What are the risks? Generally, this is a safe procedure. However, problems may occur, including:  Infection where the electrodes were inserted.  Bleeding. What happens before the procedure? Medicines Ask your health care provider about:  Changing or stopping your regular medicines. This is especially important if you are taking diabetes medicines or blood thinners.  Taking medicines such as  aspirin and ibuprofen. These medicines can thin your blood. Do not take these medicines unless your health care provider tells you to take them.  Taking over-the-counter medicines, vitamins, herbs, and supplements. General instructions  Your health care provider may ask you to avoid: ? Beverages that have caffeine, such as coffee and tea. ? Any products that contain nicotine or tobacco. These products include cigarettes, e-cigarettes, and chewing tobacco. If you need help quitting, ask your health care provider.  Do not use lotions or creams on the same day that you will be having the procedure. What happens during the procedure? For EMG  Your health care provider will ask you to stay in a position so that he or she can access the muscle that will be studied. You may be standing, sitting, or lying down.  You may be given a medicine that numbs the area (local anesthetic).  A very thin needle that has an electrode will be inserted into your muscle.  Another small electrode will be placed on your skin near the muscle.  Your health care provider will ask you to continue to remain still.  The electrodes will send a signal that tells about the electrical activity of your muscles. You may see this on a monitor or hear it in the room.  After your muscles have been studied at rest, your health care provider will ask you to contract or flex your muscles. The electrodes will send a signal that tells about the electrical activity of your muscles.  Your  health care provider will remove the electrodes and the electrode needles when the procedure is finished. The procedure may vary among health care providers and hospitals.   For NCS  An electrode that records your nerve activity (recording electrode) will be placed on your skin by the muscle that is being studied.  An electrode that is used as a reference (reference electrode) will be placed near the recording electrode.  A paste or gel will be  applied to your skin between the recording electrode and the reference electrode.  Your nerve will be stimulated with a mild shock. Your health care provider will measure how much time it takes for your muscle to react.  Your health care provider will remove the electrodes and the gel when the procedure is finished. The procedure may vary among health care providers and hospitals.   What happens after the procedure?  It is up to you to get the results of your procedure. Ask your health care provider, or the department that is doing the procedure, when your results will be ready.  Your health care provider may: ? Give you medicines for any pain. ? Monitor the insertion sites to make sure that bleeding stops. Summary  Electromyoneurogram is a test to check how well your muscles and nerves are working.  If the results of the tests are abnormal, this may indicate disease or injury.  This is a safe procedure. However, problems may occur, such as bleeding and infection.  Your health care provider will do two tests to complete this procedure. One checks your muscles (EMG) and another checks your nerves (NCS).  It is up to you to get the results of your procedure. Ask your health care provider, or the department that is doing the procedure, when your results will be ready. This information is not intended to replace advice given to you by your health care provider. Make sure you discuss any questions you have with your health care provider. Document Revised: 12/15/2017 Document Reviewed: 11/27/2017 [employer]vier Patient Education  2021 [employer].

## 2020-08-02 ENCOUNTER — Ambulatory Visit: Payer: Self-pay | Admitting: Neurology

## 2022-07-16 ENCOUNTER — Telehealth: Payer: Self-pay

## 2022-07-27 NOTE — Telephone Encounter (Signed)
error
